# Patient Record
Sex: Male | Born: 1985 | Race: White | Hispanic: No | Marital: Married | Smoking: Current every day smoker
Health system: Southern US, Community
[De-identification: ages and names within clinical notes are randomized; demographics above are authoritative.]

## PROBLEM LIST (undated history)

## (undated) DIAGNOSIS — F32A Depression, unspecified: Secondary | ICD-10-CM

## (undated) DIAGNOSIS — K219 Gastro-esophageal reflux disease without esophagitis: Secondary | ICD-10-CM

## (undated) DIAGNOSIS — F329 Major depressive disorder, single episode, unspecified: Secondary | ICD-10-CM

## (undated) DIAGNOSIS — M549 Dorsalgia, unspecified: Secondary | ICD-10-CM

## (undated) DIAGNOSIS — F419 Anxiety disorder, unspecified: Secondary | ICD-10-CM

## (undated) DIAGNOSIS — G8929 Other chronic pain: Secondary | ICD-10-CM

## (undated) HISTORY — DX: Other chronic pain: G89.29

## (undated) HISTORY — DX: Major depressive disorder, single episode, unspecified: F32.9

## (undated) HISTORY — DX: Depression, unspecified: F32.A

## (undated) HISTORY — DX: Dorsalgia, unspecified: M54.9

## (undated) HISTORY — DX: Gastro-esophageal reflux disease without esophagitis: K21.9

## (undated) HISTORY — DX: Anxiety disorder, unspecified: F41.9

## (undated) HISTORY — PX: OTHER SURGICAL HISTORY: SHX169

---
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
## 2005-01-27 ENCOUNTER — Emergency Department (HOSPITAL_COMMUNITY): Admission: EM | Admit: 2005-01-27 | Discharge: 2005-01-27 | Payer: Self-pay | Admitting: *Deleted

## 2005-09-08 ENCOUNTER — Ambulatory Visit (HOSPITAL_COMMUNITY): Admission: RE | Admit: 2005-09-08 | Discharge: 2005-09-08 | Payer: Self-pay | Admitting: Family Medicine

## 2005-09-13 ENCOUNTER — Ambulatory Visit (HOSPITAL_COMMUNITY): Admission: RE | Admit: 2005-09-13 | Discharge: 2005-09-13 | Payer: Self-pay | Admitting: Family Medicine

## 2006-05-21 ENCOUNTER — Emergency Department (HOSPITAL_COMMUNITY): Admission: EM | Admit: 2006-05-21 | Discharge: 2006-05-21 | Payer: Self-pay | Admitting: *Deleted

## 2006-07-10 ENCOUNTER — Encounter (HOSPITAL_COMMUNITY): Admission: RE | Admit: 2006-07-10 | Discharge: 2006-08-09 | Payer: Self-pay | Admitting: Pediatrics

## 2007-04-11 ENCOUNTER — Ambulatory Visit: Payer: Self-pay | Admitting: Orthopedic Surgery

## 2009-07-09 ENCOUNTER — Ambulatory Visit (HOSPITAL_COMMUNITY): Admission: RE | Admit: 2009-07-09 | Discharge: 2009-07-09 | Payer: Self-pay | Admitting: Pediatrics

## 2009-09-14 ENCOUNTER — Encounter (HOSPITAL_COMMUNITY): Admission: RE | Admit: 2009-09-14 | Discharge: 2009-10-14 | Payer: Self-pay | Admitting: Pediatrics

## 2010-01-13 ENCOUNTER — Ambulatory Visit (HOSPITAL_COMMUNITY): Admission: RE | Admit: 2010-01-13 | Discharge: 2010-01-13 | Payer: Self-pay | Admitting: Family Medicine

## 2010-01-20 ENCOUNTER — Ambulatory Visit (HOSPITAL_COMMUNITY): Admission: RE | Admit: 2010-01-20 | Discharge: 2010-01-20 | Payer: Self-pay | Admitting: Pediatrics

## 2010-09-25 ENCOUNTER — Encounter: Payer: Self-pay | Admitting: Pediatrics

## 2010-09-25 ENCOUNTER — Encounter: Payer: Self-pay | Admitting: General Surgery

## 2011-10-26 ENCOUNTER — Encounter: Payer: Self-pay | Admitting: Gastroenterology

## 2011-10-26 ENCOUNTER — Ambulatory Visit (INDEPENDENT_AMBULATORY_CARE_PROVIDER_SITE_OTHER): Payer: Self-pay | Admitting: Gastroenterology

## 2011-10-26 VITALS — BP 138/76 | HR 82 | Temp 97.2°F | Ht 69.0 in | Wt 269.0 lb

## 2011-10-26 DIAGNOSIS — K760 Fatty (change of) liver, not elsewhere classified: Secondary | ICD-10-CM

## 2011-10-26 DIAGNOSIS — K7689 Other specified diseases of liver: Secondary | ICD-10-CM

## 2011-10-26 DIAGNOSIS — K219 Gastro-esophageal reflux disease without esophagitis: Secondary | ICD-10-CM

## 2011-10-26 DIAGNOSIS — K625 Hemorrhage of anus and rectum: Secondary | ICD-10-CM

## 2011-10-26 DIAGNOSIS — R1013 Epigastric pain: Secondary | ICD-10-CM

## 2011-10-26 NOTE — Progress Notes (Signed)
Referring Provider: Dr. Halm Primary Care Physician:  HALM, STEVEN, MD, MD Primary Gastroenterologist:  Dr. Rourk  Chief Complaint  Patient presents with  . GI Problem    HPI:   [name] is a 25-year-old male who presents today at the request of Dr. Halm secondary to epigastric pain, nausea, vomiting. Reports symptoms X 1 year. Unable to eat until 12 am. Wakes up with epigastric pain, N/V each am. Reports hematemesis in the past but not recently. Pain worsened by eating. Takes Aleve every other day or prn. No aspirin powders. Constant reflux, no dysphagia. Given Omeprazole 40 mg but hasn't started yet.    About 4 mos ago noted large amount of rectal bleeding. None since. Had been having diarrhea at that time. No constipation/diarrhea recently. No lower abdominal pain.   US abd 07/2009: fatty liver HIDA Jan 2011: symptoms of abdominal cramping during HIDA. EF 50 %  Past Medical History  Diagnosis Date  . Anxiety   . Depression   . GERD (gastroesophageal reflux disease)     Past Surgical History  Procedure Date  . Cyst removed     right wrist    Current Outpatient Prescriptions  Medication Sig Dispense Refill  . clonazePAM (KLONOPIN) 1 MG tablet Take 1 mg by mouth 2 (two) times daily as needed.      . FLUoxetine (PROZAC) 20 MG capsule Take 20 mg by mouth daily.      . omeprazole (PRILOSEC) 40 MG capsule Take 40 mg by mouth daily.        Allergies as of 10/26/2011  . (No Known Allergies)    Family History  Problem Relation Age of Onset  . Colon cancer Neg Hx     History   Social History  . Marital Status: Married    Spouse Name: N/A    Number of Children: 2  . Years of Education: N/A   Occupational History  . Landscaping     summer   Social History Main Topics  . Smoking status: Never Smoker   . Smokeless tobacco: Not on file  . Alcohol Use: Yes     12 pack in a month  . Drug Use: No  . Sexually Active: Not on file   Other Topics Concern  . Not on  file   Social History Narrative  . No narrative on file    Review of Systems: Gen: Denies any fever, chills, loss of appetite, fatigue, weight loss. CV: Denies chest pain, heart palpitations, syncope, peripheral edema. Resp: Denies shortness of breath with rest, cough, wheezing GI: Denies dysphagia or odynophagia. Denies hematemesis, fecal incontinence, or jaundice.  GU : Denies urinary burning, urinary frequency, urinary incontinence.  MS: Denies joint pain, muscle weakness, cramps, limited movement Derm: Denies rash, itching, dry skin Psych: Denies depression, anxiety, confusion or memory loss  Heme: Denies bruising, bleeding, and enlarged lymph nodes.  Physical Exam: BP 138/76  Pulse 82  Temp(Src) 97.2 F (36.2 C) (Temporal)  Ht 5' 9" (1.753 m)  Wt 269 lb (122.018 kg)  BMI 39.72 kg/m2 General:   Alert and oriented. Well-developed, well-nourished, pleasant and cooperative. Head:  Normocephalic and atraumatic. Eyes:  Conjunctiva pink, sclera clear, no icterus.    Ears:  Normal auditory acuity. Nose:  No deformity, discharge,  or lesions. Mouth:  No deformity or lesions, mucosa pink and moist.  Neck:  Supple, without mass or thyromegaly. Lungs:  Clear to auscultation bilaterally, without wheezing, rales, or rhonchi.  Heart:  S1, S2   present without murmurs noted.  Abdomen:  +BS, soft, TTP epigastric region and non-distended. Without mass or HSM. No rebound or guarding. No hernias noted. Rectal:  Deferred  Msk:  Symmetrical without gross deformities. Normal posture. Extremities:  Without clubbing or edema. Neurologic:  Alert and  oriented x4;  grossly normal neurologically. Skin:  Intact, warm and dry without significant lesions or rashes Cervical Nodes:  No significant cervical adenopathy. Psych:  Alert and cooperative. Normal mood and affect.   

## 2011-10-26 NOTE — Patient Instructions (Signed)
Avoid alcohol and medicines such as Aleve, Advil, Ibuprofen, Motrin.   Start taking Omeprazole 40 mg daily, which was prescribed by Dr. Milford Cage.  Please have blood work done. We will call you with these results.  We have set you up for an upper endoscopy with Dr. Jena Gauss to look at your stomach. We may need to do further tests once this is completed.   Please review the low-fat diet. Work on 1-2 lbs of weight loss a week. This will help keep your liver healthy as well as help with reflux symptoms.

## 2011-10-27 LAB — CBC WITH DIFFERENTIAL/PLATELET
HCT: 44.7 % (ref 39.0–52.0)
Hemoglobin: 15.7 g/dL (ref 13.0–17.0)
Lymphocytes Relative: 39 % (ref 12–46)
Lymphs Abs: 2.3 10*3/uL (ref 0.7–4.0)
MCHC: 35.1 g/dL (ref 30.0–36.0)
Monocytes Absolute: 0.7 10*3/uL (ref 0.1–1.0)
Monocytes Relative: 11 % (ref 3–12)
Neutro Abs: 2.9 10*3/uL (ref 1.7–7.7)
RBC: 4.97 MIL/uL (ref 4.22–5.81)

## 2011-10-27 LAB — HEPATIC FUNCTION PANEL
ALT: 21 U/L (ref 0–53)
AST: 22 U/L (ref 0–37)
Alkaline Phosphatase: 61 U/L (ref 39–117)
Bilirubin, Direct: 0.1 mg/dL (ref 0.0–0.3)
Indirect Bilirubin: 0.2 mg/dL (ref 0.0–0.9)

## 2011-10-29 DIAGNOSIS — R1013 Epigastric pain: Secondary | ICD-10-CM | POA: Insufficient documentation

## 2011-10-29 DIAGNOSIS — K625 Hemorrhage of anus and rectum: Secondary | ICD-10-CM | POA: Insufficient documentation

## 2011-10-29 DIAGNOSIS — K760 Fatty (change of) liver, not elsewhere classified: Secondary | ICD-10-CM | POA: Insufficient documentation

## 2011-10-29 DIAGNOSIS — K219 Gastro-esophageal reflux disease without esophagitis: Secondary | ICD-10-CM | POA: Insufficient documentation

## 2011-10-29 NOTE — Assessment & Plan Note (Signed)
26 year old male with at least one year of epigastric pain, N/V, episode of hematemesis in the past but none recently. Severe GERD noted, no dysphagia. No current PPI, although he has been prescribed Omeprazole by Dr. Milford Cage. Pain worsened by eating, somewhat routine use of NSAIDs, no aspirin powders. [place] of abd in Nov 2010 with fatty liver, HIDA 1/11 with EF 50%, +reproduction of symptoms during CCK infusion. Question of gastritis, PUD, in the setting of NSAIDs. May need updated US/HIDA if EGD findings negative.   Proceed with upper endoscopy in the near future with Dr. Jena Gauss. The risks, benefits, and alternatives have been discussed in detail with patient. They have stated understanding and desire to proceed.  Prilosec daily

## 2011-10-29 NOTE — Assessment & Plan Note (Signed)
Chronic, multifactorial. Needs dietary modification and wt loss. Start Prilosec. EGD in near future for dyspepsia and hematemesis.

## 2011-10-29 NOTE — Assessment & Plan Note (Signed)
Documented by [place] of abd in 2010. Needs updated LFTs.

## 2011-10-29 NOTE — Assessment & Plan Note (Signed)
Episode of hematochezia in remote past; no further incidence. Occurred during episode of diarrhea. Likely benign source. Will need TCS in future, but patient does not want to proceed with lower GI evaluation at this point.

## 2011-10-30 NOTE — Progress Notes (Signed)
Faxed to PCP

## 2011-11-03 ENCOUNTER — Encounter (HOSPITAL_COMMUNITY): Payer: Self-pay | Admitting: Pharmacy Technician

## 2011-11-03 ENCOUNTER — Telehealth: Payer: Self-pay | Admitting: Gastroenterology

## 2011-11-03 MED ORDER — SODIUM CHLORIDE 0.45 % IV SOLN
Freq: Once | INTRAVENOUS | Status: AC
  Administered 2011-11-06: 14:00:00 via INTRAVENOUS

## 2011-11-03 NOTE — Telephone Encounter (Signed)
Pt aware of new arrival time of 12:50

## 2011-11-06 ENCOUNTER — Encounter (HOSPITAL_COMMUNITY): Payer: Self-pay | Admitting: *Deleted

## 2011-11-06 ENCOUNTER — Encounter (HOSPITAL_COMMUNITY)
Admission: RE | Disposition: A | Discharge status: Home / Self Care | Payer: Self-pay | Source: Ambulatory Visit | Attending: Internal Medicine

## 2011-11-06 ENCOUNTER — Ambulatory Visit (HOSPITAL_COMMUNITY)
Admission: RE | Admit: 2011-11-06 | Discharge: 2011-11-06 | Disposition: A | Discharge status: Home / Self Care | Payer: Medicaid Other | Source: Ambulatory Visit | Attending: Internal Medicine | Admitting: Internal Medicine

## 2011-11-06 DIAGNOSIS — K2960 Other gastritis without bleeding: Secondary | ICD-10-CM

## 2011-11-06 DIAGNOSIS — R1013 Epigastric pain: Secondary | ICD-10-CM | POA: Insufficient documentation

## 2011-11-06 DIAGNOSIS — R112 Nausea with vomiting, unspecified: Secondary | ICD-10-CM | POA: Insufficient documentation

## 2011-11-06 DIAGNOSIS — K219 Gastro-esophageal reflux disease without esophagitis: Secondary | ICD-10-CM

## 2011-11-06 DIAGNOSIS — K2100 Gastro-esophageal reflux disease with esophagitis, without bleeding: Secondary | ICD-10-CM | POA: Insufficient documentation

## 2011-11-06 HISTORY — PX: ESOPHAGOGASTRODUODENOSCOPY: SHX5428

## 2011-11-06 SURGERY — EGD (ESOPHAGOGASTRODUODENOSCOPY)
Anesthesia: Moderate Sedation

## 2011-11-06 MED ORDER — BUTAMBEN-TETRACAINE-BENZOCAINE 2-2-14 % EX AERO
INHALATION_SPRAY | CUTANEOUS | Status: DC | PRN
  Administered 2011-11-06: 1 via TOPICAL

## 2011-11-06 MED ORDER — MIDAZOLAM HCL 5 MG/5ML IJ SOLN
INTRAMUSCULAR | Status: AC
  Filled 2011-11-06: qty 10

## 2011-11-06 MED ORDER — MIDAZOLAM HCL 5 MG/5ML IJ SOLN
INTRAMUSCULAR | Status: DC | PRN
  Administered 2011-11-06: 2 mg via INTRAVENOUS
  Administered 2011-11-06 (×5): 1 mg via INTRAVENOUS

## 2011-11-06 MED ORDER — MEPERIDINE HCL 100 MG/ML IJ SOLN
INTRAMUSCULAR | Status: DC | PRN
  Administered 2011-11-06 (×2): 25 mg via INTRAVENOUS
  Administered 2011-11-06: 50 mg via INTRAVENOUS
  Administered 2011-11-06 (×3): 25 mg via INTRAVENOUS

## 2011-11-06 MED ORDER — STERILE WATER FOR IRRIGATION IR SOLN
Status: DC | PRN
  Administered 2011-11-06: 15:00:00

## 2011-11-06 MED ORDER — MEPERIDINE HCL 100 MG/ML IJ SOLN
INTRAMUSCULAR | Status: AC
  Filled 2011-11-06: qty 2

## 2011-11-06 NOTE — Op Note (Signed)
[facility] [address] [place], [place]  [address]  ENDOSCOPY PROCEDURE REPORT  PATIENT:  Benjamin Chase, Benjamin Chase  MR#:  [id number] BIRTHDATE:  1986-05-01, 25 yrs. old  GENDER:  male  ENDOSCOPIST:  R. Roetta Sessions, MD Caleen Essex Referred by:  Vivia Ewing, M.D.  PROCEDURE DATE:  11/06/2011 PROCEDURE:  Diagnostic EGD  INDICATIONS:     Refractory GERD; recent CBC and LFTs completely normal through our office.  INFORMED CONSENT:   The risks, benefits, limitations, alternatives and imponderables have been discussed.  The potential for biopsy, esophogeal dilation, etc. have also been reviewed.  Questions have been answered.  All parties agreeable.  Please see the history and physical in the medical record for more information.  MEDICATIONS: IV Demerol and Versed in incremental doses. Cetacaine spray  DESCRIPTION OF PROCEDURE:   The [id number] ([id number]) endoscope was introduced through the mouth and advanced to the second portion of the duodenum without difficulty or limitations.  The mucosal surfaces were surveyed very carefully during advancement of the scope and upon withdrawal.  Retroflexion view of the proximal stomach and esophagogastric junction was performed.  <<PROCEDUREIMAGES>>  FINDINGS:  Couple of tiny mucosal breaks at the GE junction. No Barrett's esophagus. Stomach empty. Normal gastric mucosa aside from a couple of tiny antral erosions. Patent pylorus. Normal first and second portion of the duodenum  THERAPEUTIC / DIAGNOSTIC MANEUVERS PERFORMED: None  COMPLICATIONS:   None  IMPRESSION:  Changes of mild reflux esophagitis. Couple tiny antral erosions of doubtful clinical significance.  RECOMMENDATIONS:  Trial of AcipHex 20 mg orally daily x2 weeks.  ______________________________ R. Roetta Sessions, MD Caleen Essex  CC:  n. eSIGNED:   R. Roetta Sessions at 11/06/2011 03:48 PM  Clearance Coots, [phone]

## 2011-11-06 NOTE — Interval H&P Note (Signed)
History and Physical Interval Note:  11/06/2011 3:06 PM  Benjamin Chase  has presented today for surgery, with the diagnosis of epigastric pain  The various methods of treatment have been discussed with the patient and family. After consideration of risks, benefits and other options for treatment, the patient has consented to  Procedure(s) (LRB): ESOPHAGOGASTRODUODENOSCOPY (EGD) (N/A) as a surgical intervention .  The patients' history has been reviewed, patient examined, no change in status, stable for surgery.  I have reviewed the patients' chart and labs.  Questions were answered to the patient's satisfaction.     [name]

## 2011-11-06 NOTE — H&P (View-Only) (Signed)
Referring Provider: Dr. Milford Chase Primary Care Physician:  Benjamin Ewing, MD, MD Primary Gastroenterologist:  Benjamin Chase  Chief Complaint  Patient presents with  . GI Problem    HPI:   Benjamin Chase is a 26 year old male who presents today at the request of Benjamin Chase secondary to epigastric pain, nausea, vomiting. Reports symptoms X 1 year. Unable to eat until 12 am. Wakes up with epigastric pain, N/V each am. Reports hematemesis in the past but not recently. Pain worsened by eating. Takes Aleve every other day or prn. No aspirin powders. Constant reflux, no dysphagia. Given Omeprazole 40 mg but hasn't started yet.    About 4 mos ago noted large amount of rectal bleeding. None since. Had been having diarrhea at that time. No constipation/diarrhea recently. No lower abdominal pain.   [place] abd 07/2009: fatty liver HIDA Jan 2011: symptoms of abdominal cramping during HIDA. EF 50 %  Past Medical History  Diagnosis Date  . Anxiety   . Depression   . GERD (gastroesophageal reflux disease)     Past Surgical History  Procedure Date  . Cyst removed     right wrist    Current Outpatient Prescriptions  Medication Sig Dispense Refill  . clonazePAM (KLONOPIN) 1 MG tablet Take 1 mg by mouth 2 (two) times daily as needed.      Marland Kitchen FLUoxetine (PROZAC) 20 MG capsule Take 20 mg by mouth daily.      Marland Kitchen omeprazole (PRILOSEC) 40 MG capsule Take 40 mg by mouth daily.        Allergies as of 10/26/2011  . (No Known Allergies)    Family History  Problem Relation Age of Onset  . Colon cancer Neg Hx     History   Social History  . Marital Status: Married    Spouse Name: N/A    Number of Children: 2  . Years of Education: N/A   Occupational History  . Landscaping     summer   Social History Main Topics  . Smoking status: Never Smoker   . Smokeless tobacco: Not on file  . Alcohol Use: Yes     12 pack in a month  . Drug Use: No  . Sexually Active: Not on file   Other Topics Concern  . Not on  file   Social History Narrative  . No narrative on file    Review of Systems: Gen: Denies any fever, chills, loss of appetite, fatigue, weight loss. CV: Denies chest pain, heart palpitations, syncope, peripheral edema. Resp: Denies shortness of breath with rest, cough, wheezing GI: Denies dysphagia or odynophagia. Denies hematemesis, fecal incontinence, or jaundice.  GU : Denies urinary burning, urinary frequency, urinary incontinence.  MS: Denies joint pain, muscle weakness, cramps, limited movement Derm: Denies rash, itching, dry skin Psych: Denies depression, anxiety, confusion or memory loss  Heme: Denies bruising, bleeding, and enlarged lymph nodes.  Physical Exam: BP 138/76  Pulse 82  Temp(Src) 97.2 F (36.2 C) (Temporal)  Ht 5\' 9"  (1.753 m)  Wt 269 lb (122.018 kg)  BMI 39.72 kg/m2 General:   Alert and oriented. Well-developed, well-nourished, pleasant and cooperative. Head:  Normocephalic and atraumatic. Eyes:  Conjunctiva pink, sclera clear, no icterus.    Ears:  Normal auditory acuity. Nose:  No deformity, discharge,  or lesions. Mouth:  No deformity or lesions, mucosa pink and moist.  Neck:  Supple, without mass or thyromegaly. Lungs:  Clear to auscultation bilaterally, without wheezing, rales, or rhonchi.  Heart:  S1, S2  present without murmurs noted.  Abdomen:  +BS, soft, TTP epigastric region and non-distended. Without mass or HSM. No rebound or guarding. No hernias noted. Rectal:  Deferred  Msk:  Symmetrical without gross deformities. Normal posture. Extremities:  Without clubbing or edema. Neurologic:  Alert and  oriented x4;  grossly normal neurologically. Skin:  Intact, warm and dry without significant lesions or rashes Cervical Nodes:  No significant cervical adenopathy. Psych:  Alert and cooperative. Normal mood and affect.

## 2011-11-06 NOTE — Discharge Instructions (Signed)
EGD Discharge instructions Please read the instructions outlined below and refer to this sheet in the next few weeks. These discharge instructions provide you with general information on caring for yourself after you leave the hospital. Your doctor may also give you specific instructions. While your treatment has been planned according to the most current medical practices available, unavoidable complications occasionally occur. If you have any problems or questions after discharge, please call your doctor. ACTIVITY  You may resume your regular activity but move at a slower pace for the next 24 hours.   Take frequent rest periods for the next 24 hours.   Walking will help expel (get rid of) the air and reduce the bloated feeling in your abdomen.   No driving for 24 hours (because of the anesthesia (medicine) used during the test).   You may shower.   Do not sign any important legal documents or operate any machinery for 24 hours (because of the anesthesia used during the test).  NUTRITION  Drink plenty of fluids.   You may resume your normal diet.   Begin with a light meal and progress to your normal diet.   Avoid alcoholic beverages for 24 hours or as instructed by your caregiver.  MEDICATIONS  You may resume your normal medications unless your caregiver tells you otherwise.  WHAT YOU CAN EXPECT TODAY  You may experience abdominal discomfort such as a feeling of fullness or "gas" pains.  FOLLOW-UP  Your doctor will discuss the results of your test with you.  SEEK IMMEDIATE MEDICAL ATTENTION IF ANY OF THE FOLLOWING OCCUR:  Excessive nausea (feeling sick to your stomach) and/or vomiting.   Severe abdominal pain and distention (swelling).   Trouble swallowing.   Temperature over 101 F (37.8 C).   Rectal bleeding or vomiting of blood.    GERD information provided.  Try AcipHex 20 mg orally daily x2 weeks. Use coupon for free medication. Let me know how this medication  works for you after you've finished it.Gastroesophageal Reflux Disease, Adult Gastroesophageal reflux disease (GERD) happens when acid from your stomach flows up into the esophagus. When acid comes in contact with the esophagus, the acid causes soreness (inflammation) in the esophagus. Over time, GERD may create small holes (ulcers) in the lining of the esophagus. CAUSES   Increased body weight. This puts pressure on the stomach, making acid rise from the stomach into the esophagus.   Smoking. This increases acid production in the stomach.   Drinking alcohol. This causes decreased pressure in the lower esophageal sphincter (valve or ring of muscle between the esophagus and stomach), allowing acid from the stomach into the esophagus.   Late evening meals and a full stomach. This increases pressure and acid production in the stomach.   A malformed lower esophageal sphincter.  Sometimes, no cause is found. SYMPTOMS   Burning pain in the lower part of the mid-chest behind the breastbone and in the mid-stomach area. This may occur twice a week or more often.   Trouble swallowing.   Sore throat.   Dry cough.   Asthma-like symptoms including chest tightness, shortness of breath, or wheezing.  DIAGNOSIS  Your caregiver may be able to diagnose GERD based on your symptoms. In some cases, X-rays and other tests may be done to check for complications or to check the condition of your stomach and esophagus. TREATMENT  Your caregiver may recommend over-the-counter or prescription medicines to help decrease acid production. Ask your caregiver before starting or adding any  new medicines.  HOME CARE INSTRUCTIONS   Change the factors that you can control. Ask your caregiver for guidance concerning weight loss, quitting smoking, and alcohol consumption.   Avoid foods and drinks that make your symptoms worse, such as:   Caffeine or alcoholic drinks.   Chocolate.   Peppermint or mint flavorings.     Garlic and onions.   Spicy foods.   Citrus fruits, such as oranges, lemons, or limes.   Tomato-based foods such as sauce, chili, salsa, and pizza.   Fried and fatty foods.   Avoid lying down for the 3 hours prior to your bedtime or prior to taking a nap.   Eat small, frequent meals instead of large meals.   Wear loose-fitting clothing. Do not wear anything tight around your waist that causes pressure on your stomach.   Raise the head of your bed 6 to 8 inches with wood blocks to help you sleep. Extra pillows will not help.   Only take over-the-counter or prescription medicines for pain, discomfort, or fever as directed by your caregiver.   Do not take aspirin, ibuprofen, or other nonsteroidal anti-inflammatory drugs (NSAIDs).  SEEK IMMEDIATE MEDICAL CARE IF:   You have pain in your arms, neck, jaw, teeth, or back.   Your pain increases or changes in intensity or duration.   You develop nausea, vomiting, or sweating (diaphoresis).   You develop shortness of breath, or you faint.   Your vomit is green, yellow, black, or looks like coffee grounds or blood.   Your stool is red, bloody, or black.  These symptoms could be signs of other problems, such as heart disease, gastric bleeding, or esophageal bleeding. MAKE SURE YOU:   Understand these instructions.   Will watch your condition.   Will get help right away if you are not doing well or get worse.  Document Released: 05/31/2005 Document Revised: 08/10/2011 Document Reviewed: 03/10/2011 [facility] Patient Information 2012 [place], [place].

## 2011-11-07 NOTE — Progress Notes (Signed)
Quick Note:  Labs normal. Underwent EGD 3/4. Needs to f/u up in our office in next 4-6 weeks, reassess abdominal pain, rectal bleeding.   ______

## 2011-11-07 NOTE — Progress Notes (Signed)
Quick Note:  Mailed letter to pt.  SS- please schedule appt. ______

## 2011-11-10 ENCOUNTER — Encounter (HOSPITAL_COMMUNITY): Payer: Self-pay | Admitting: Internal Medicine

## 2011-11-10 ENCOUNTER — Encounter: Payer: Self-pay | Admitting: Gastroenterology

## 2011-12-07 ENCOUNTER — Ambulatory Visit: Payer: Medicaid Other | Admitting: Gastroenterology

## 2012-04-15 IMAGING — CR DG HAND COMPLETE 3+V*L*
3 series · 3 of 3 positions shown · non-contrast
Comparison: None.

CLINICAL DATA: Hit wall, pain and swelling, with laceration at
fifth MCP joint

LEFT HAND - COMPLETE 3+ VIEW

[view not recorded (1 of 3)]
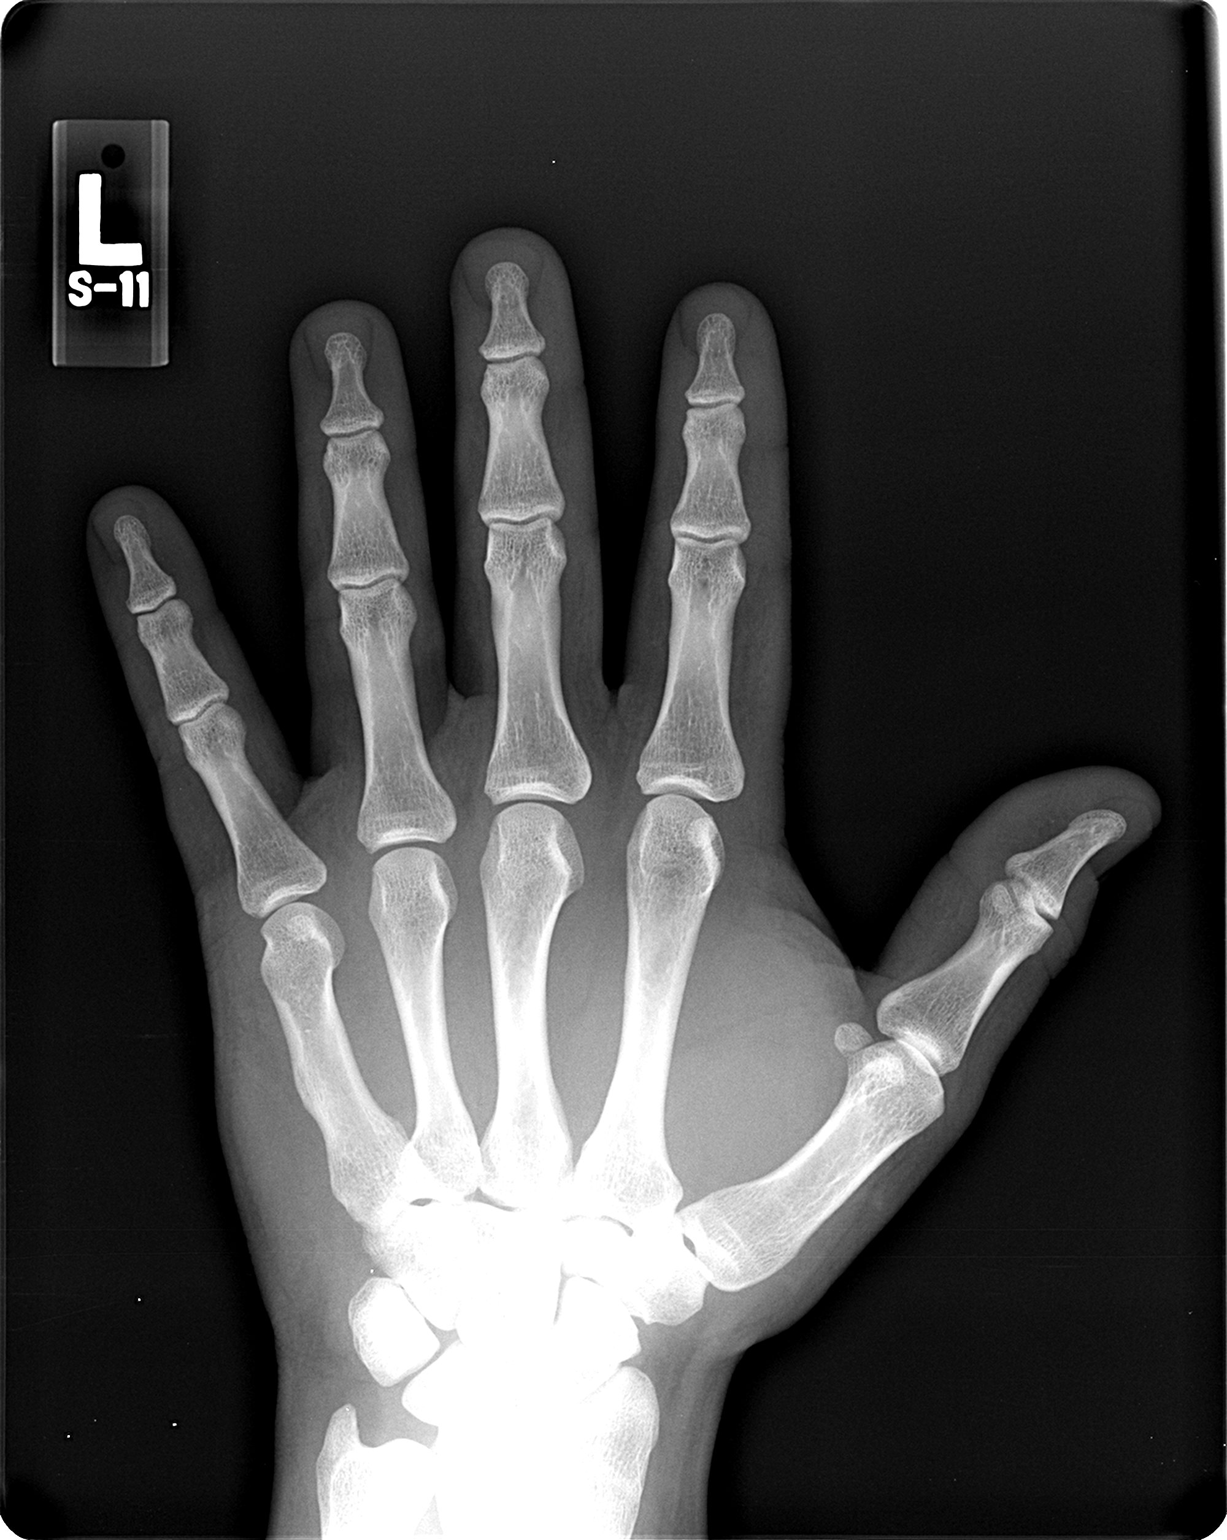

[view not recorded (2 of 3)]
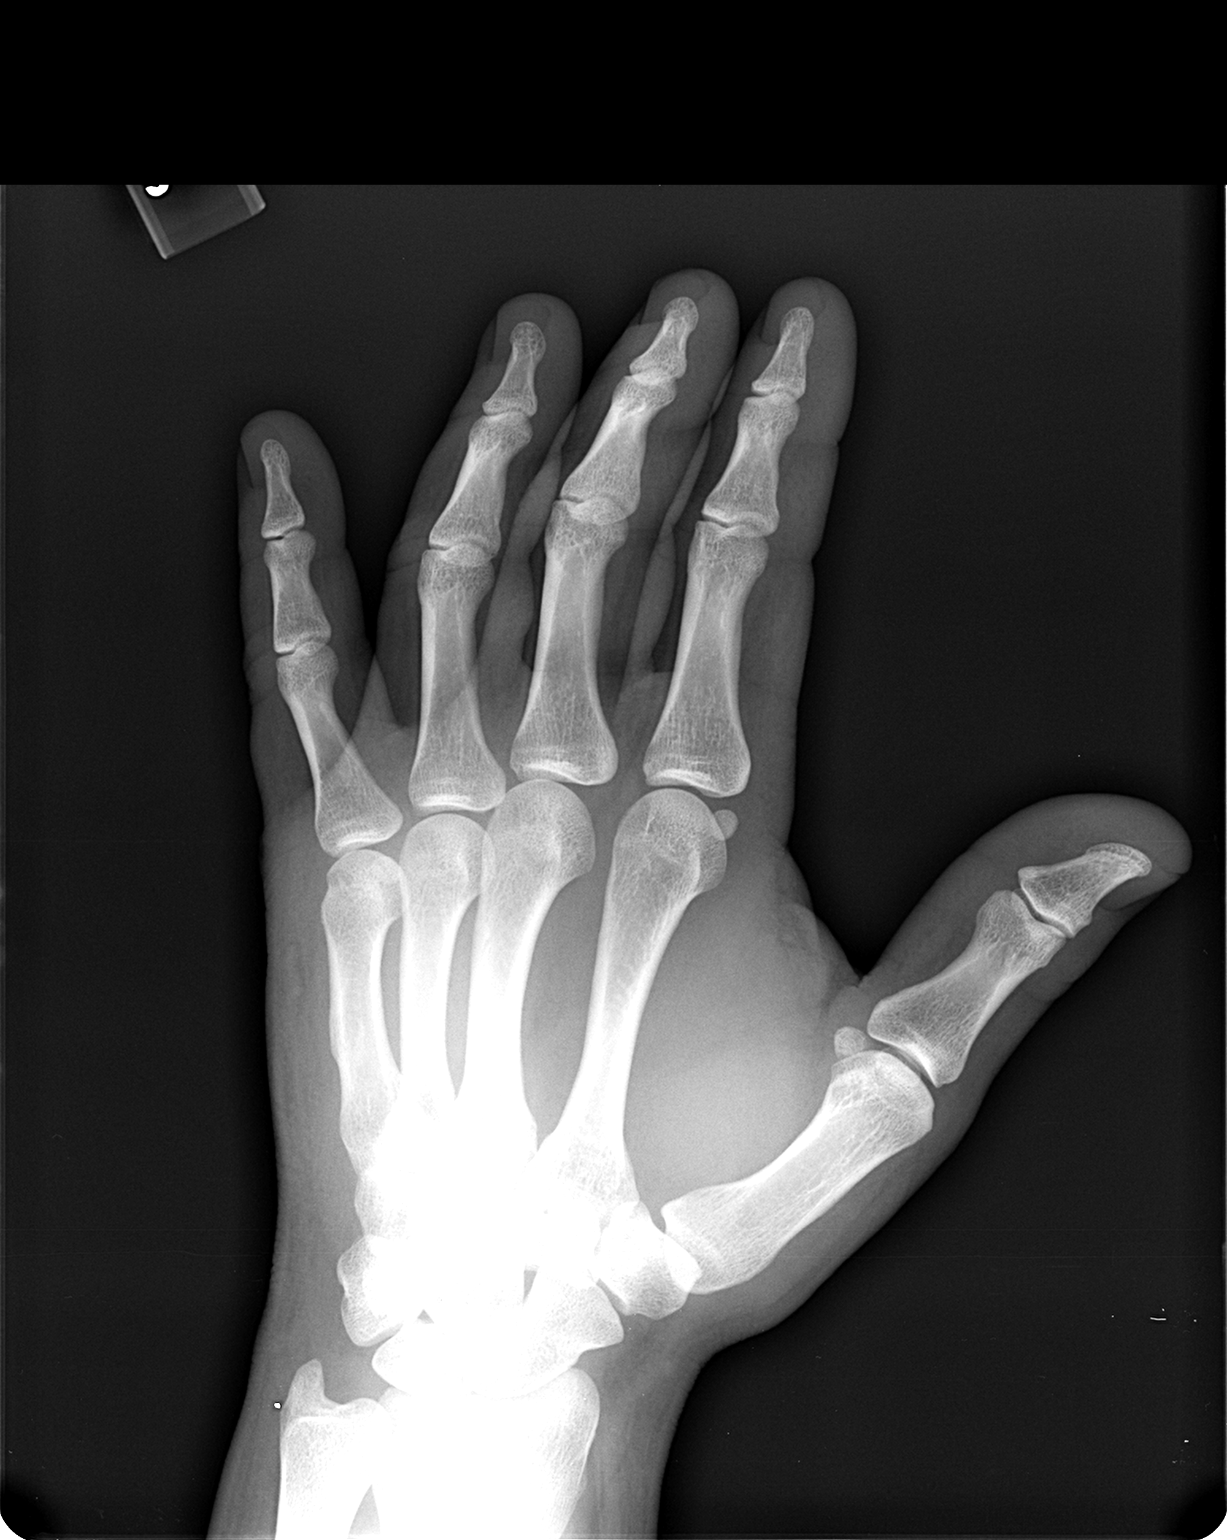

[view not recorded (3 of 3)]
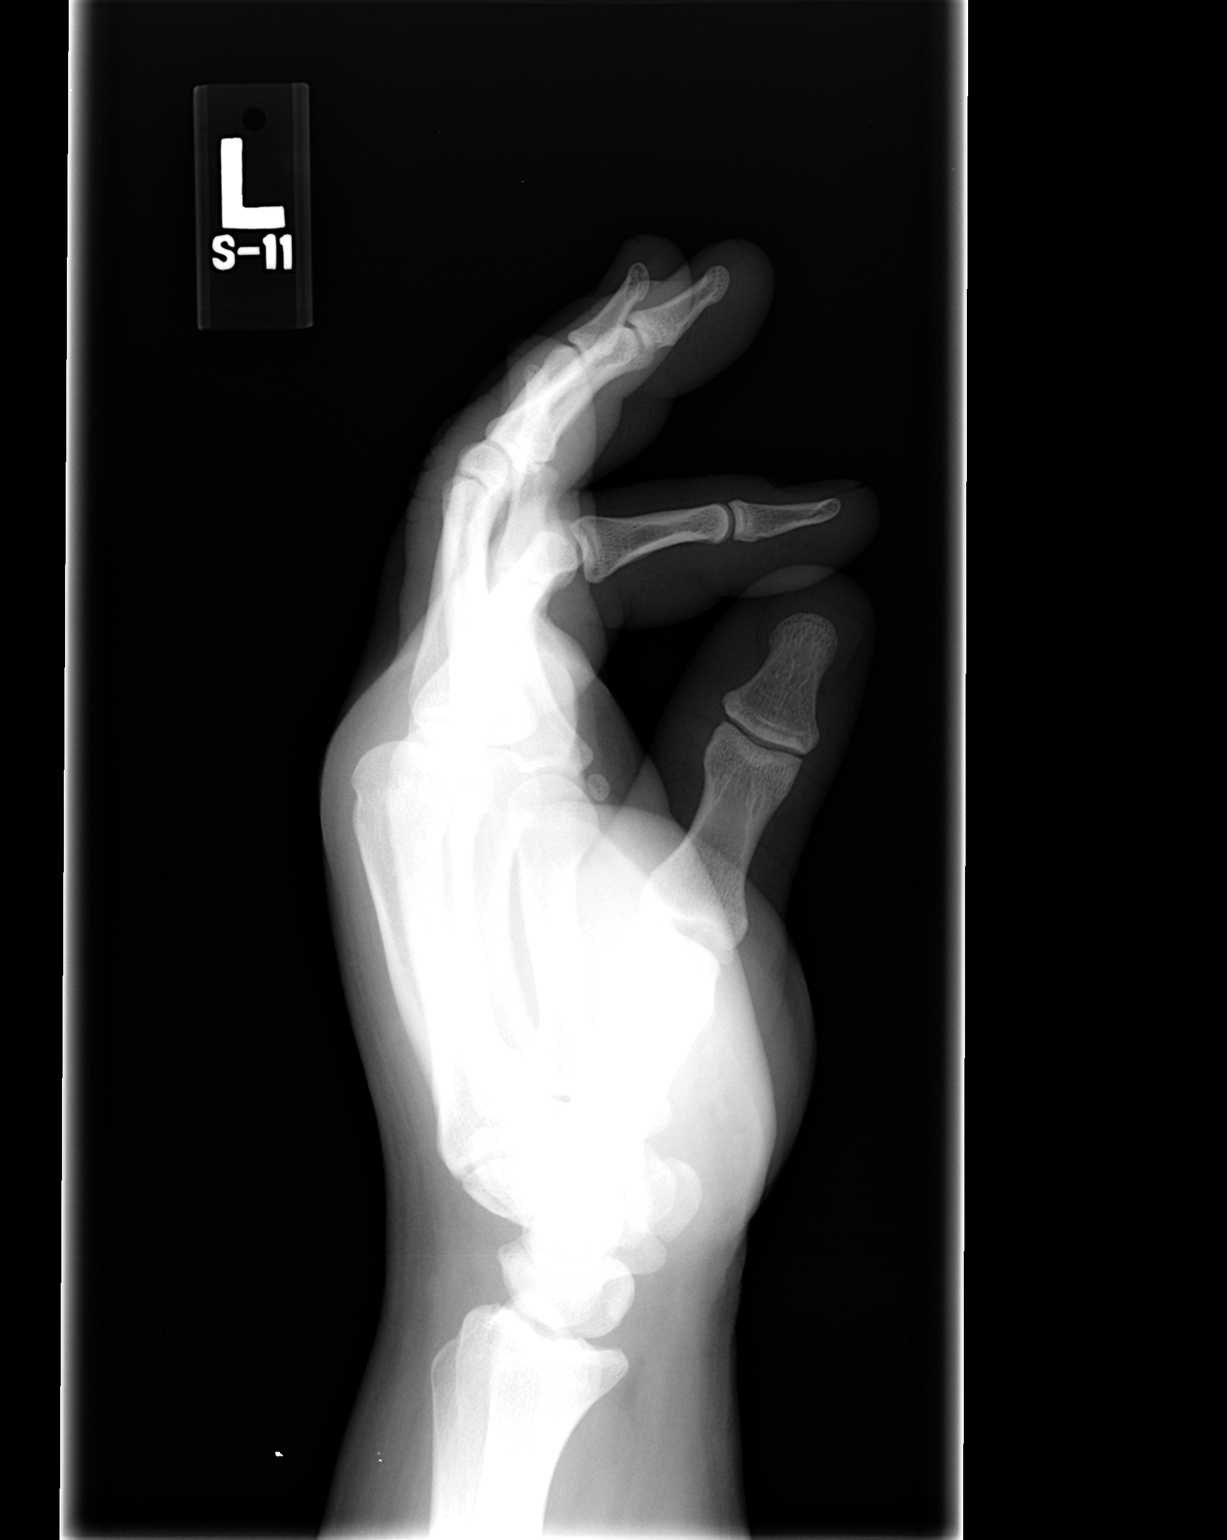

[3 of 3 positions shown; findings below may reference images not displayed]

FINDINGS: There is no evidence of fracture or dislocation.  There
is no evidence of arthropathy or other focal bone abnormality.
Soft tissues are unremarkable except for mild lateral soft tissue
swelling.
IMPRESSION: Negative for acute bony abnormality.

## 2012-05-15 ENCOUNTER — Encounter: Payer: Self-pay | Admitting: Internal Medicine

## 2012-05-16 ENCOUNTER — Ambulatory Visit (INDEPENDENT_AMBULATORY_CARE_PROVIDER_SITE_OTHER): Payer: Medicaid Other | Admitting: Urgent Care

## 2012-05-16 ENCOUNTER — Other Ambulatory Visit: Payer: Self-pay | Admitting: Internal Medicine

## 2012-05-16 ENCOUNTER — Encounter: Payer: Self-pay | Admitting: Urgent Care

## 2012-05-16 VITALS — BP 131/67 | HR 62 | Temp 98.2°F | Ht 67.0 in | Wt 274.0 lb

## 2012-05-16 DIAGNOSIS — K219 Gastro-esophageal reflux disease without esophagitis: Secondary | ICD-10-CM

## 2012-05-16 DIAGNOSIS — R1032 Left lower quadrant pain: Secondary | ICD-10-CM

## 2012-05-16 DIAGNOSIS — K921 Melena: Secondary | ICD-10-CM

## 2012-05-16 DIAGNOSIS — K625 Hemorrhage of anus and rectum: Secondary | ICD-10-CM

## 2012-05-16 MED ORDER — PEG 3350-KCL-NA BICARB-NACL 420 G PO SOLR: 4000.0000 mL | ORAL | Status: AC

## 2012-05-16 MED ORDER — PANTOPRAZOLE SODIUM 40 MG PO TBEC: 40.0000 mg | DELAYED_RELEASE_TABLET | Freq: Two times a day (BID) | ORAL | Status: DC

## 2012-05-16 NOTE — Assessment & Plan Note (Addendum)
Benjamin Chase is a pleasant 26 y.o. male with intermittent hematochezia & LLQ pain.  ?NSAID-enteropathy, inflammatory bowel disease, benign anorectal source or colorectal polyp or neoplasia.  Colonoscopy for further evaluation w/ Dr Jena Gauss.  I have discussed risks & benefits which include, but are not limited to, bleeding, infection, perforation & drug reaction.  The patient agrees with this plan & written consent will be obtained.

## 2012-05-16 NOTE — Progress Notes (Signed)
Faxed to PCP

## 2012-05-16 NOTE — Assessment & Plan Note (Signed)
Resume PPI in way of protonix 40mg  BID

## 2012-05-16 NOTE — Progress Notes (Signed)
Primary Care Physician:  Vivia Ewing, MD Primary Gastroenterologist:  Dr. Jena Gauss  Chief Complaint  Patient presents with  . Follow-up  . Diarrhea  . Emesis  . Abdominal Pain    HPI:  Benjamin Chase is a pleasant 26 y.o. male here for follow-up GERD.  He had EGD by Dr. Jena Gauss March 2013 & had changes of mild erosive reflux esophagitis.  He has tried Prilosec 20mg  daily & BID, as well as Aciphex 20mg  daily without relief.  Unfortunately, he has not been on PPI as he never followed up with [place] or Dr Milford Cage.  C/o lower abdominal pain, mostly LLQ.  States it "feels like a knot that hurts" w/ BM.  He has been seing small amounts of bright red rectal bleeding with wiping.  Pain is worse w/ defecation.  BM 3-4 loose to formed stools daily.  Denies mucus in stools.  He uses Aleve 2-4 once per week for headaches.  Denies other NSAIDs.  C/o nausea & vomiting every morning & after meals.  Weight up 5# in past 6 months.  Pain 6/10.  Past Medical History  Diagnosis Date  . Anxiety   . Depression   . GERD (gastroesophageal reflux disease)   . Chronic back pain     Past Surgical History  Procedure Date  . Cyst removed     right wrist  . Esophagogastroduodenoscopy 11/06/2011    Rourk-Changes of mild reflux esophagitis/Couple tiny antral erosions of doubtful clinical significance    Current Outpatient Prescriptions  Medication Sig Dispense Refill  . clonazePAM (KLONOPIN) 1 MG tablet Take 1 mg by mouth 2 (two) times daily.       Marland Kitchen FLUoxetine (PROZAC) 20 MG capsule Take 20 mg by mouth daily.      Marland Kitchen tiZANidine (ZANAFLEX) 4 MG tablet Take 4 mg by mouth every 8 (eight) hours as needed.      . pantoprazole (PROTONIX) 40 MG tablet Take 1 tablet (40 mg total) by mouth 2 (two) times daily before a meal.  62 tablet  2    Allergies as of 05/16/2012  . (No Known Allergies)    Family History:There is no known family history of colorectal carcinoma , liver disease, or inflammatory bowel disease.  Problem  Relation Age of Onset  . Colon cancer Neg Hx     History   Social History  . Marital Status: Married    Spouse Name: N/A    Number of Children: 2  . Years of Education: N/A   Occupational History  . Landscaping     summer   Social History Main Topics  . Smoking status: Former Smoker -- 0.5 packs/day for 2 years  . Smokeless tobacco: Not on file  . Alcohol Use: No     12 pack in a month/former user last drink in 2 months  . Drug Use: No  . Sexually Active: Not on file  Review of Systems: Gen: Denies any fever, chills, sweats, anorexia, fatigue, weakness, malaise, weight loss, and sleep disorder CV: Denies chest pain, angina, palpitations, syncope, orthopnea, PND, peripheral edema, and claudication. Resp: Denies dyspnea at rest, dyspnea with exercise, cough, sputum, wheezing, coughing up blood, and pleurisy. GI: Denies vomiting blood, jaundice, and fecal incontinence.    Derm: Denies rash, itching, dry skin, hives, moles, warts, or unhealing ulcers.  Psych: Denies depression, anxiety, memory loss, suicidal ideation, hallucinations, paranoia, and confusion. Heme: Denies bruising, bleeding, and enlarged lymph nodes.  Physical Exam: BP 131/67  Pulse 62  Temp 98.2 F (36.8 C) (Temporal)  Ht 5\' 7"  (1.702 m)  Wt 274 lb (124.286 kg)  BMI 42.91 kg/m2 No LMP for male patient. General:   Alert,  Well-developed, obese, pleasant and cooperative in NAD.  Accompanied by his wife. Head:  Normocephalic and atraumatic. Eyes:  Sclera clear, no icterus.   Conjunctiva pink. Ears:  Normal auditory acuity. Nose:  No deformity, discharge,  or lesions. Mouth:  No deformity or lesions, dentition normal. Neck:  Supple; no masses or thyromegaly. Lungs:  Clear throughout to auscultation.   No wheezes, crackles, or rhonchi. No acute distress. Heart:  Regular rate and rhythm; no murmurs, clicks, rubs,  or gallops. Abdomen:  Protuberant.  Normal bowel sounds.  Soft and nondistended. +mild TTP LLQ.   No masses, hepatosplenomegaly or hernias noted. No guarding or rebound tenderness.   Rectal:  Deferred until time of colonoscopy.   Msk:  Symmetrical without gross deformities. Normal posture. Pulses:  Normal pulses noted. Extremities:  Without clubbing or edema. Neurologic:  Alert and  oriented x4;  grossly normal neurologically. Skin:  Intact without significant lesions or rashes. Cervical Nodes:  No significant cervical adenopathy. Psych:  Alert and cooperative. Normal mood and affect.

## 2012-05-16 NOTE — Patient Instructions (Addendum)
Colonoscopy w/ Dr Jena Gauss Begin protonix 30 min before breakfast & dinner To ER if severe pain or bleeding

## 2012-05-16 NOTE — Assessment & Plan Note (Signed)
See "rectal bleeding."

## 2012-06-06 ENCOUNTER — Telehealth: Payer: Self-pay

## 2012-06-06 ENCOUNTER — Other Ambulatory Visit: Payer: Self-pay

## 2012-06-06 DIAGNOSIS — K625 Hemorrhage of anus and rectum: Secondary | ICD-10-CM

## 2012-06-06 NOTE — Telephone Encounter (Signed)
Pt aware to go to the lab for blood work and that we will be calling in him a Rx to [facility]. i faxed the order over to the lab. He is going now.

## 2012-06-06 NOTE — Telephone Encounter (Signed)
Rx has been called in  

## 2012-06-06 NOTE — Telephone Encounter (Signed)
CBC Anusol HC supp 1 PR BID, #20, 0RF

## 2012-06-06 NOTE — Telephone Encounter (Signed)
Pt called- he has had rectal bleeding x 1 week, the bleeding happens with bm's and he has also noticed blood in his underware. Usually the blood is bright red but yesterday it was light red and  dark red and he noticed blood clots. He is having pain, burning and itching in his rectum and it worried about doing having multiple bm's while doing the prep Sunday for his tcs on Monday. I informed the pt that KJ had put on his AVS that he should go to ED if he had any further pain and bleeding. Pt stated he was aware of that but he wanted to check with [place] before he went to ED to see if there was anything we could do. Please advise.  pts phone number is [phone].

## 2012-06-07 LAB — CBC WITH DIFFERENTIAL/PLATELET
Basophils Absolute: 0 10*3/uL (ref 0.0–0.1)
Eosinophils Relative: 4 % (ref 0–5)
HCT: 44.3 % (ref 39.0–52.0)
Lymphocytes Relative: 35 % (ref 12–46)
Lymphs Abs: 2 10*3/uL (ref 0.7–4.0)
MCV: 91.3 fL (ref 78.0–100.0)
Monocytes Absolute: 0.4 10*3/uL (ref 0.1–1.0)
RDW: 13.5 % (ref 11.5–15.5)
WBC: 5.5 10*3/uL (ref 4.0–10.5)

## 2012-06-07 NOTE — Progress Notes (Signed)
Quick Note:  Please call patient. His blood count is well above normal which is good news. Keep colonoscopy as planned. CC: Vivia Ewing, MD  ______

## 2012-06-09 NOTE — Progress Notes (Signed)
Patient called. Scarring is prepped for a pars colonoscopy. Has been passing blood for 3 weeks. Feels low but worse today. Wonders if he should go to the emergency department. No abdominal pain or dizziness or other symptoms. He has anorectal pain with defecation. I told him he likely has a fissure plus minus hemorrhoids. And oftentimes the degree of blood seen is out of proportion to the actual blood loss. However, the only way to be sure it is for him to go to the emergency room and be examined. He sounds quite anxious.  I recommended he come to the emergency room and be evaluated

## 2012-06-10 ENCOUNTER — Ambulatory Visit (HOSPITAL_COMMUNITY)
Admission: RE | Admit: 2012-06-10 | Discharge: 2012-06-10 | Disposition: A | Discharge status: Home / Self Care | Payer: Medicaid Other | Source: Ambulatory Visit | Attending: Internal Medicine | Admitting: Internal Medicine

## 2012-06-10 ENCOUNTER — Encounter (HOSPITAL_COMMUNITY)
Admission: RE | Disposition: A | Discharge status: Home / Self Care | Payer: Self-pay | Source: Ambulatory Visit | Attending: Internal Medicine

## 2012-06-10 ENCOUNTER — Encounter (HOSPITAL_COMMUNITY): Payer: Self-pay | Admitting: *Deleted

## 2012-06-10 DIAGNOSIS — K6289 Other specified diseases of anus and rectum: Secondary | ICD-10-CM | POA: Insufficient documentation

## 2012-06-10 DIAGNOSIS — K219 Gastro-esophageal reflux disease without esophagitis: Secondary | ICD-10-CM

## 2012-06-10 DIAGNOSIS — K921 Melena: Secondary | ICD-10-CM

## 2012-06-10 DIAGNOSIS — R933 Abnormal findings on diagnostic imaging of other parts of digestive tract: Secondary | ICD-10-CM

## 2012-06-10 DIAGNOSIS — K5289 Other specified noninfective gastroenteritis and colitis: Secondary | ICD-10-CM | POA: Insufficient documentation

## 2012-06-10 HISTORY — PX: COLONOSCOPY: SHX5424

## 2012-06-10 SURGERY — COLONOSCOPY
Anesthesia: Moderate Sedation

## 2012-06-10 MED ORDER — STERILE WATER FOR IRRIGATION IR SOLN
Status: DC | PRN
  Administered 2012-06-10: 16:00:00

## 2012-06-10 MED ORDER — MEPERIDINE HCL 100 MG/ML IJ SOLN
INTRAMUSCULAR | Status: AC
  Filled 2012-06-10: qty 2

## 2012-06-10 MED ORDER — MEPERIDINE HCL 100 MG/ML IJ SOLN
INTRAMUSCULAR | Status: DC | PRN
  Administered 2012-06-10 (×3): 50 mg via INTRAVENOUS

## 2012-06-10 MED ORDER — MIDAZOLAM HCL 5 MG/5ML IJ SOLN
INTRAMUSCULAR | Status: AC
  Filled 2012-06-10: qty 10

## 2012-06-10 MED ORDER — SODIUM CHLORIDE 0.45 % IV SOLN
INTRAVENOUS | Status: DC
  Administered 2012-06-10: 1000 mL via INTRAVENOUS

## 2012-06-10 MED ORDER — MIDAZOLAM HCL 5 MG/5ML IJ SOLN
INTRAMUSCULAR | Status: DC | PRN
  Administered 2012-06-10 (×3): 2 mg via INTRAVENOUS

## 2012-06-10 NOTE — Op Note (Signed)
[facility] [address] [place], [address]   COLONOSCOPY PROCEDURE REPORT  PATIENT: Benjamin Chase, Benjamin Chase  MR#:         [id number] BIRTHDATE: [birth year] , 25  yrs. old GENDER: Male ENDOSCOPIST: R.  Roetta Sessions, MD FACP FACG REFERRED BY:  Vivia Ewing, M.D. PROCEDURE DATE:  06/10/2012 PROCEDURE:     ileocolonoscopy with ileal and sigmoid biopsy  INDICATIONS: hematochezia; anorectal pain.  INFORMED CONSENT:  The risks, benefits, alternatives and imponderables including but not limited to bleeding, perforation as well as the possibility of a missed lesion have been reviewed.  The potential for biopsy, lesion removal, etc. have also been discussed.  Questions have been answered.  All parties agreeable. Please see the history and physical in the medical record for more information.  MEDICATIONS: Versed 6 mg IV and Demerol 100 mg IV in divided doses the  DESCRIPTION OF PROCEDURE:  After a digital rectal exam was performed, the EC-[id number] ([id number])  colonoscope was advanced from the anus through the rectum and colon to the area of the cecum, ileocecal valve and appendiceal orifice.  The cecum was deeply intubated.  These structures were well-seen and photographed for the record.  From the level of the cecum and ileocecal valve, the scope was slowly and cautiously withdrawn.  The mucosal surfaces were carefully surveyed utilizing scope tip deflection to facilitate fold flattening as needed.  The scope was pulled down into the rectum where a thorough examination including retroflexion was performed.    FINDINGS:  Adequate preparation. Friable anorectum. No discrete fissure identified with the colonoscope; otherwise, normal rectum. Focal areas of submucosal petechial hemorrhage in the sigmoid segment; otherwise, the remainder of the colonic mucosa appeared normal. The distal 10 cm of the terminal ileal mucosa appeared normal with erosions and early ulcerations  scattered about the distal 5 cm of terminal ileal mucosa. Please see photos.  THERAPEUTIC / DIAGNOSTIC MANEUVERS PERFORMED:  biopsies of the abnormal terminal ileal and sigmoid mucosa taken  COMPLICATIONS: none  CECAL WITHDRAWAL TIME:  9 minute  IMPRESSION:  excoriated anorectum. I suspect a occult Fisher, clinically. Abnormal sigmoid and terminal ileum mucosa of uncertain significance status post biopsy  RECOMMENDATIONS: avoid nonsteroidal agents. Trial of 0.4% topical nitroglycerin to the anorectum twice a day. Patient is set down for 30 minutes after taking nitroglycerin to watch for dizziness/low blood pressure. Tylenol as needed for headache. Further recommendations to follow pending review of pathology report.   _______________________________ eSigned:  R. Roetta Sessions, MD FACP [facility] 06/10/2012 4:28 PM   CC:    PATIENT NAME:  Benjamin Chase, Benjamin Chase MR#: [id number]

## 2012-06-16 ENCOUNTER — Encounter: Payer: Self-pay | Admitting: Internal Medicine

## 2012-06-19 ENCOUNTER — Encounter (HOSPITAL_COMMUNITY): Payer: Self-pay | Admitting: Internal Medicine

## 2012-06-19 ENCOUNTER — Encounter: Payer: Self-pay | Admitting: *Deleted

## 2012-07-11 ENCOUNTER — Encounter: Payer: Self-pay | Admitting: Internal Medicine

## 2012-07-15 ENCOUNTER — Ambulatory Visit: Payer: Medicaid Other | Admitting: Gastroenterology

## 2013-05-09 ENCOUNTER — Encounter: Payer: Self-pay | Admitting: Family Medicine

## 2013-05-23 DIAGNOSIS — Z0289 Encounter for other administrative examinations: Secondary | ICD-10-CM

## 2016-03-09 ENCOUNTER — Encounter: Payer: Self-pay | Admitting: Internal Medicine

## 2016-04-17 ENCOUNTER — Ambulatory Visit: Payer: Medicaid Other | Admitting: Nurse Practitioner

## 2016-04-17 ENCOUNTER — Encounter: Payer: Self-pay | Admitting: Nurse Practitioner

## 2016-04-17 ENCOUNTER — Telehealth: Payer: Self-pay | Admitting: Nurse Practitioner

## 2016-04-17 NOTE — Telephone Encounter (Signed)
PT WAS A NO SHOW AND LETTER SENT  °

## 2016-04-17 NOTE — Telephone Encounter (Signed)
Noted  

## 2016-04-18 ENCOUNTER — Telehealth (HOSPITAL_COMMUNITY): Payer: Self-pay

## 2016-04-18 NOTE — Telephone Encounter (Signed)
8/15 L/M to call back and schedule an appt / [phone] 

## 2016-05-17 ENCOUNTER — Encounter: Payer: Self-pay | Admitting: Cardiology

## 2016-05-17 ENCOUNTER — Ambulatory Visit (INDEPENDENT_AMBULATORY_CARE_PROVIDER_SITE_OTHER): Payer: Medicaid Other | Admitting: Cardiology

## 2016-05-17 VITALS — BP 104/64 | HR 68 | Ht 69.0 in | Wt 237.0 lb

## 2016-05-17 DIAGNOSIS — R0789 Other chest pain: Secondary | ICD-10-CM

## 2016-05-17 DIAGNOSIS — R9431 Abnormal electrocardiogram [ECG] [EKG]: Secondary | ICD-10-CM

## 2016-05-17 DIAGNOSIS — R55 Syncope and collapse: Secondary | ICD-10-CM

## 2016-05-17 NOTE — Patient Instructions (Addendum)
Medication Instructions:  Continue all current medications.  Labwork: none  Testing/Procedures:  Your physician has requested that you have an echocardiogram. Echocardiography is a painless test that uses sound waves to create images of your heart. It provides your doctor with information about the size and shape of your heart and how well your heart's chambers and valves are working. This procedure takes approximately one hour. There are no restrictions for this procedure.  Office will contact with results via phone or letter.    Follow-Up: 1 month   Any Other Special Instructions Will Be Listed Below (If Applicable).  If you need a refill on your cardiac medications before your next appointment, please call your pharmacy.  

## 2016-05-17 NOTE — Progress Notes (Signed)
Clinical Summary [name] is a 30 y.o.male seen today as a new patient. She is referred by PA [address] [place] [address] [place].   1. Chest pain - started about 8 months ago. Sharp pain left into left neck, can be 10/10. Worst with deep breathing, + positional. +SOB. Feels diaphoretic. Lasts just a few minutes, or can last up to 45 minutes. - over last several days has been constant.  - recent stress with divorce  - significant episode on August 10th while at work, was using chainsaw. Felt nauseous and throwing up x 4-5 times. Working in 95 degree heat. Was walking over to truck, dropped to knees and reportedly loss of consciousness. Similar episode in December while working.  - over last year increasing DOE.  - chronic orthostatic symptoms.   CAD risk factors: HTN(now off meds), + cigarettes, maternal grandmother in her 5750s Occasional EtOH. + marijuana history. History of cocaine use in past, last use 2-3 months ago.     2. 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 Anxiety   . Chronic back pain   . Depression   . GERD (gastroesophageal reflux disease)      No Known Allergies   Current Outpatient Prescriptions  Medication Sig Dispense Refill  . clonazePAM (KLONOPIN) 1 MG tablet Take 1 mg by mouth 2 (two) times daily.     Marland Kitchen. FLUoxetine (PROZAC) 20 MG capsule Take 20 mg by mouth daily.    Marland Kitchen. HYDROcodone-acetaminophen (NORCO/VICODIN) 5-325 MG per tablet Take 1 tablet by mouth every 6 (six) hours as needed.    . pantoprazole (PROTONIX) 40 MG tablet Take 1 tablet (40 mg total) by mouth 2 (two) times daily before a meal. 62 tablet 2  . tiZANidine (ZANAFLEX) 4 MG tablet Take 4 mg by mouth every 8 (eight) hours as needed.     No current facility-administered medications for this visit.      Past Surgical History:  Procedure Laterality Date  . COLONOSCOPY  06/10/2012   RMR: excoriated anorectum. I suspect a occult Fisher clinically.  Abnormal sigmoid and terminal ileum mucosa of uncertain significance status post biopsy  . cyst removed     right wrist  . ESOPHAGOGASTRODUODENOSCOPY  11/06/2011   Rourk-Changes of mild reflux esophagitis/Couple tiny antral erosions of doubtful clinical significance     No Known Allergies    Family History  Problem Relation Age of Onset  . Colon cancer Neg Hx      Social History [name] reports that he has quit smoking. He has a 1.00 pack-year smoking history. He does not have any smokeless tobacco history on file. [name] reports that he does not drink alcohol.   Review of Systems CONSTITUTIONAL: No weight loss, fever, chills, weakness or fatigue.  HEENT: Eyes: No visual loss, blurred vision, double vision or yellow sclerae.No hearing loss, sneezing, congestion, runny nose or sore throat.  SKIN: No rash or itching.  CARDIOVASCULAR: per HPI RESPIRATORY: No shortness of breath, cough or sputum.  GASTROINTESTINAL: No anorexia, nausea, vomiting or diarrhea. No abdominal pain or blood.  GENITOURINARY: No burning on urination, no polyuria NEUROLOGICAL: per HPI MUSCULOSKELETAL: No muscle, back pain, joint pain or stiffness.  LYMPHATICS: No enlarged nodes. No history of splenectomy.  PSYCHIATRIC: No history of depression or anxiety.  ENDOCRINOLOGIC: No reports of sweating, cold or heat intolerance. No polyuria or polydipsia.  Marland Kitchen.   Physical Examination Vitals:   05/17/16 1339  BP: 104/64  Pulse: 68   Vitals:   05/17/16 1339  Weight: 237 lb (107.5 kg)  Height: 5\' 9"  (1.753 m)    Gen: resting comfortably, no acute distress HEENT: no scleral icterus, pupils equal round and reactive, no palptable cervical adenopathy,  CV: RRR, no m/r/g, no jvd Resp: Clear to auscultation bilaterally GI: abdomen is soft, non-tender, non-distended, normal bowel sounds, no hepatosplenomegaly MSK: extremities are warm, no edema.  Skin: warm, no rash Neuro:  no focal deficits Psych:  appropriate affect     Assessment and Plan  1. Chest pain/Syncope - atypical symptoms for cardiac related chest pain. Syncope episodes likely related to hypovolemia/dehydration. He is orhtostatic in clinic as well today - EKG in clinic shows NSR, no ischemic changes - given chest pain symptoms and syncope we will obtain echo to rule out structural heart disease - advised to increase hydration   F/u 1 month      Antoine Poche, M.D., F.A.C.C.

## 2016-06-01 ENCOUNTER — Other Ambulatory Visit: Payer: Self-pay

## 2016-06-01 ENCOUNTER — Ambulatory Visit (INDEPENDENT_AMBULATORY_CARE_PROVIDER_SITE_OTHER): Payer: Medicaid Other

## 2016-06-01 DIAGNOSIS — R0789 Other chest pain: Secondary | ICD-10-CM

## 2016-06-06 ENCOUNTER — Telehealth: Payer: Self-pay | Admitting: *Deleted

## 2016-06-06 NOTE — Telephone Encounter (Signed)
LM on pt voicemail with normal results - routed to pcp

## 2016-06-06 NOTE — Telephone Encounter (Signed)
-----   Message from Antoine PocheJonathan F Branch, MD sent at 06/05/2016 12:28 PM EDT ----- Echo looks good, heart function is normal. Will discuss further at f/u  Dominga FerryJ Branch MD

## 2016-06-20 ENCOUNTER — Ambulatory Visit: Payer: Medicaid Other | Admitting: Cardiology

## 2016-06-20 ENCOUNTER — Encounter: Payer: Self-pay | Admitting: Cardiology

## 2016-06-20 NOTE — Progress Notes (Deleted)
Clinical Summary [name] is a 30 y.o.male seen today for follow up of the following medical problems.   1. Chest pain - started about 8 months ago. Sharp pain left into left neck, can be 10/10. Worst with deep breathing, + positional. +SOB. Feels diaphoretic. Lasts just a few minutes, or can last up to 45 minutes. - over last several days has been constant.  - recent stress with divorce  - significant episode on August 10th while at work, was using chainsaw. Felt nauseous and throwing up x 4-5 times. Working in 95 degree heat. Was walking over to truck, dropped to knees and reportedly loss of consciousness. Similar episode in December while working.  - over last year increasing DOE.  - chronic orthostatic symptoms.   CAD risk factors: HTN(now off meds), + cigarettes, maternal grandmother in her 21s Occasional EtOH. + marijuana history. History of cocaine use in past, last use 2-3 months ago.    /05/2016 echo LVEF 60-65%, no WMAs.    2. 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 Anxiety   . Chronic back pain   . Depression   . GERD (gastroesophageal reflux disease)      No Known Allergies   Current Outpatient Prescriptions  Medication Sig Dispense Refill  . HYDROcodone-acetaminophen (NORCO) 10-325 MG tablet Take 1-2 tablets by mouth 2 (two) times daily as needed (pain).     No current facility-administered medications for this visit.      Past Surgical History:  Procedure Laterality Date  . COLONOSCOPY  06/10/2012   RMR: excoriated anorectum. I suspect a occult Fisher clinically. Abnormal sigmoid and terminal ileum mucosa of uncertain significance status post biopsy  . cyst removed     right wrist  . ESOPHAGOGASTRODUODENOSCOPY  11/06/2011   Rourk-Changes of mild reflux esophagitis/Couple tiny antral erosions of doubtful clinical significance     No Known Allergies    Family History  Problem  Relation Age of Onset  . CAD Maternal Grandmother   . Heart attack Paternal Grandfather   . Colon cancer Neg Hx      Social History Mr. Gamblin reports that he has been smoking Cigarettes.  He started smoking about 11 years ago. He has a 1.00 pack-year smoking history. He has never used smokeless tobacco. Mr. Amos reports that he does not drink alcohol.   Review of Systems CONSTITUTIONAL: No weight loss, fever, chills, weakness or fatigue.  HEENT: Eyes: No visual loss, blurred vision, double vision or yellow sclerae.No hearing loss, sneezing, congestion, runny nose or sore throat.  SKIN: No rash or itching.  CARDIOVASCULAR:  RESPIRATORY: No shortness of breath, cough or sputum.  GASTROINTESTINAL: No anorexia, nausea, vomiting or diarrhea. No abdominal pain or blood.  GENITOURINARY: No burning on urination, no polyuria NEUROLOGICAL: No headache, dizziness, syncope, paralysis, ataxia, numbness or tingling in the extremities. No change in bowel or bladder control.  MUSCULOSKELETAL: No muscle, back pain, joint pain or stiffness.  LYMPHATICS: No enlarged nodes. No history of splenectomy.  PSYCHIATRIC: No history of depression or anxiety.  ENDOCRINOLOGIC: No reports of sweating, cold or heat intolerance. No polyuria or polydipsia.  Marland Kitchen   Physical Examination There were no vitals filed for this visit. There were no vitals filed for this visit.  Gen: resting comfortably, no acute distress HEENT: no scleral icterus, pupils equal round and reactive, no palptable cervical adenopathy,  CV Resp: Clear to auscultation bilaterally GI:  abdomen is soft, non-tender, non-distended, normal bowel sounds, no hepatosplenomegaly MSK: extremities are warm, no edema.  Skin: warm, no rash Neuro:  no focal deficits Psych: appropriate affect   Diagnostic Studies 05/2016 echo Study Conclusions  - Left ventricle: The cavity size was normal. Wall thickness was   normal. Systolic function was normal.  The estimated ejection   fraction was in the range of 60% to 65%. Wall motion was normal;   there were no regional wall motion abnormalities. Left   ventricular diastolic function parameters were normal. - Aortic valve: Valve area (VTI): 3.19 cm^2. Valve area (Vmax):   3.23 cm^2. Valve area (Vmean): 3.19 cm^2. - Technically adequate study.    Assessment and Plan    1. Chest pain/Syncope - atypical symptoms for cardiac related chest pain. Syncope episodes likely related to hypovolemia/dehydration. He is orhtostatic in clinic as well today - EKG in clinic shows NSR, no ischemic changes - given chest pain symptoms and syncope we will obtain echo to rule out structural heart disease - advised to increase hydration   F/u 1 month    Antoine PocheJonathan F. Lealer Marsland, M.D., F.A.C.C.

## 2021-03-23 ENCOUNTER — Encounter: Payer: Self-pay | Admitting: *Deleted

## 2021-03-24 ENCOUNTER — Ambulatory Visit: Payer: Self-pay | Admitting: Cardiology

## 2022-10-30 ENCOUNTER — Emergency Department (HOSPITAL_COMMUNITY)
Admission: EM | Admit: 2022-10-30 | Discharge: 2022-10-30 | Disposition: A | Discharge status: Home / Self Care | Payer: Self-pay | Attending: Emergency Medicine | Admitting: Emergency Medicine

## 2022-10-30 ENCOUNTER — Emergency Department (HOSPITAL_COMMUNITY): Payer: Self-pay

## 2022-10-30 DIAGNOSIS — W228XXA Striking against or struck by other objects, initial encounter: Secondary | ICD-10-CM | POA: Insufficient documentation

## 2022-10-30 DIAGNOSIS — S0990XA Unspecified injury of head, initial encounter: Secondary | ICD-10-CM

## 2022-10-30 DIAGNOSIS — Y990 Civilian activity done for income or pay: Secondary | ICD-10-CM | POA: Insufficient documentation

## 2022-10-30 DIAGNOSIS — S060X0A Concussion without loss of consciousness, initial encounter: Secondary | ICD-10-CM | POA: Insufficient documentation

## 2022-10-30 MED ORDER — METHOCARBAMOL 500 MG PO TABS: 1000.0000 mg | ORAL_TABLET | Freq: Four times a day (QID) | ORAL | 0 refills | Status: AC | PRN

## 2022-10-30 NOTE — ED Notes (Signed)
Patient transported to CT 

## 2022-10-30 NOTE — ED Triage Notes (Signed)
Patient here for evaluation of blurred vision and photophobia after getting hit in the head with the metal bucket of a front-end loader on Saturday. Patient states he was knocked to the ground, unsure of loss of consciousness. Patient is alert, oriented, ambulating independently with steady gait and is in no apparent distress at this time.

## 2022-10-30 NOTE — ED Provider Notes (Signed)
[facility] Provider Note   CSN: [id number] Arrival date & time: 10/30/22  0944     History  Chief Complaint  Patient presents with   Head Injury    Benjamin Chase is a 37 y.o. male.  Patient presents to ED for a head injury. He reports getting hit at the back of his head with a frontloader bucket on 10/28/22 while at work. Patient reports noticing some bleeding. No LOC or subsequent confusion or vomiting. Patient has been experiencing a headache with photophobia, and vision changes on the left eye described as blurriness. He reports all symptoms are getting progressively worse. He has also experienced loss of appetite and trouble sleeping. He attempted to go to work this morning where he had a hard time focusing his vision on the left eye.  He does not wear corrective lenses and denies history of eye surgeries.  Patient has been feeling nauseous. Patient continues to deny shortness of breath and vomiting.  No weakness, numbness, or tingling in the arms of the legs.       Home Medications Prior to Admission medications   Medication Sig Start Date End Date Taking? Authorizing Provider  HYDROcodone-acetaminophen (NORCO) 10-325 MG tablet Take 1-2 tablets by mouth 2 (two) times daily as needed (pain).    [provider]      Allergies    Patient has no known allergies.    Review of Systems   Review of Systems  Physical Exam Updated Vital Signs BP 131/64 (BP Location: Right Arm)   Pulse (!) 57   Temp 97.7 F (36.5 C) (Oral)   Resp 18   SpO2 98%   Physical Exam Vitals and nursing note reviewed.  Constitutional:      Appearance: He is well-developed.  HENT:     Head: Normocephalic. No raccoon eyes or Battle's sign.     Comments: There is slight abrasion without laceration over the crown of the head    Right Ear: Tympanic membrane, ear canal and external ear normal. No hemotympanum.     Left Ear: Tympanic membrane, ear  canal and external ear normal. No hemotympanum.     Nose: Nose normal.  Eyes:     General: Lids are normal.     Conjunctiva/sclera: Conjunctivae normal.     Pupils: Pupils are equal, round, and reactive to light.     Comments: No visible hyphema  Cardiovascular:     Rate and Rhythm: Normal rate and regular rhythm.  Pulmonary:     Effort: Pulmonary effort is normal.     Breath sounds: Normal breath sounds.  Abdominal:     Palpations: Abdomen is soft.     Tenderness: There is no abdominal tenderness.  Musculoskeletal:        General: Normal range of motion.     Cervical back: Normal range of motion and neck supple. No tenderness or bony tenderness.     Thoracic back: No tenderness or bony tenderness.     Lumbar back: No tenderness or bony tenderness.  Skin:    General: Skin is warm and dry.  Neurological:     Mental Status: He is alert and oriented to person, place, and time.     GCS: GCS eye subscore is 4. GCS verbal subscore is 5. GCS motor subscore is 6.     Cranial Nerves: No cranial nerve deficit.     Sensory: No sensory deficit.     Coordination: Coordination normal.  ED Results / Procedures / Treatments   Labs (all labs ordered are listed, but only abnormal results are displayed) Labs Reviewed - No data to display  EKG None  Radiology No results found.  Procedures Procedures    Medications Ordered in ED Medications - No data to display  ED Course/ Medical Decision Making/ A&P    Patient seen and examined. History obtained directly from patient.   Labs/EKG: None ordered  Imaging: Ordered CT head/neck.  Medications/Fluids:  None  Most recent vital signs reviewed and are as follows: BP 131/64 (BP Location: Right Arm)   Pulse (!) 57   Temp 97.7 F (36.5 C) (Oral)   Resp 18   SpO2 98%   Initial impression: Concussion symptoms  11:45 AM Reassessment performed. Patient appears stable.  Sitting in chair at bedside.  Imaging personally visualized  and interpreted including: CT head and cervical spine, agree negative.  Reviewed pertinent lab work and imaging with patient at bedside. Questions answered.   Most current vital signs reviewed and are as follows: BP 131/64 (BP Location: Right Arm)   Pulse (!) 57   Temp 97.7 F (36.5 C) (Oral)   Resp 18   SpO2 98%   Plan: Discharge to home.   Prescriptions written for: Robaxin  Patient counseled on proper use of muscle relaxant medication.  They were told not to drink alcohol, drive any vehicle, or do any dangerous activities while taking this medication.  Patient verbalized understanding.  Other home care instructions discussed: Cognitive rest, avoidance of activities which makes his symptoms worse.  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  He will be taken out of work for the next several days.  ED return instructions discussed: Patient was counseled on head injury precautions and symptoms that should indicate their return to the ED.  These include severe worsening headache, vision changes, confusion, loss of consciousness, trouble walking, nausea & vomiting, or weakness/tingling in extremities.    Follow-up instructions discussed: Patient encouraged to follow-up with their PCP in 3 days, also given information for [facility].                            Medical Decision Making Amount and/or Complexity of Data Reviewed Radiology: ordered.   Patient with head injury 2 days ago, reassuring neurologic exam.  Does report some blurry vision in the left eye, but has a reassuring ocular exam.  Head and neck imaging are negative.  No sign of hyphema.  EOMI. at this time we will treat as concussion and have patient follow-up as above.  The patient's vital signs, pertinent lab work and imaging were reviewed and interpreted as discussed in the ED course. Hospitalization was considered for further testing, treatments, or serial  exams/observation. However as patient is well-appearing, has a stable exam, and reassuring studies today, I do not feel that they warrant admission at this time. This plan was discussed with the patient who verbalizes agreement and comfort with this plan and seems reliable and able to return to the Emergency Department with worsening or changing symptoms.          Final Clinical Impression(s) / ED Diagnoses Final diagnoses:  Concussion without loss of consciousness, initial encounter  Minor head injury, initial encounter    Rx / DC Orders ED Discharge Orders     None         Carlisle Cater, PA-C  10/30/22 [place], Dana, DO 10/30/22 1455

## 2022-10-30 NOTE — Discharge Instructions (Signed)
Please read and follow all provided instructions.  Your diagnoses today include:  1. Concussion without loss of consciousness, initial encounter   2. Minor head injury, initial encounter     Tests performed today include: CT scan of your head and neck that did not show any serious injury. Vital signs. See below for your results today.   Medications prescribed:  Robaxin (methocarbamol) - muscle relaxer medication  DO NOT drive or perform any activities that require you to be awake and alert because this medicine can make you drowsy.   Take any prescribed medications only as directed.  Home care instructions:  Follow any educational materials contained in this packet.  BE VERY CAREFUL not to take multiple medicines containing Tylenol (also called acetaminophen). Doing so can lead to an overdose which can damage your liver and cause liver failure and possibly death.   Follow-up instructions: Please follow-up with your primary care provider in the next 3 days for further evaluation of your symptoms and follow-up with the concussion clinic as needed.  Return instructions:  SEEK IMMEDIATE MEDICAL ATTENTION IF: There is confusion or drowsiness (although children frequently become drowsy after injury).  You cannot awaken the injured person.  You have more than one episode of vomiting.  You notice dizziness or unsteadiness which is getting worse, or inability to walk.  You have convulsions or unconsciousness.  You experience severe, persistent headaches not relieved by Tylenol. You cannot use arms or legs normally.  There are changes in pupil sizes. (This is the black center in the colored part of the eye)  There is clear or bloody discharge from the nose or ears.  You have change in speech, vision, swallowing, or understanding.  Localized weakness, numbness, tingling, or change in bowel or bladder control. You have any other emergent concerns.  Additional Information: You have had a  head injury which does not appear to require admission at this time.  Your vital signs today were: BP 131/64 (BP Location: Right Arm)   Pulse (!) 57   Temp 97.7 F (36.5 C) (Oral)   Resp 18   SpO2 98%  If your blood pressure (BP) was elevated above 135/85 this visit, please have this repeated by your doctor within one month. --------------

## 2024-07-16 ENCOUNTER — Other Ambulatory Visit: Payer: Self-pay | Admitting: Orthopedic Surgery

## 2024-07-16 DIAGNOSIS — M25312 Other instability, left shoulder: Secondary | ICD-10-CM
